# Patient Record
Sex: Female | Born: 1960 | Race: White | Hispanic: No | Marital: Married | Smoking: Former smoker
Health system: Southern US, Community
[De-identification: ages and names within clinical notes are randomized; demographics above are authoritative.]

## PROBLEM LIST (undated history)

## (undated) DIAGNOSIS — L309 Dermatitis, unspecified: Secondary | ICD-10-CM

## (undated) DIAGNOSIS — Z9109 Other allergy status, other than to drugs and biological substances: Secondary | ICD-10-CM

## (undated) DIAGNOSIS — E119 Type 2 diabetes mellitus without complications: Secondary | ICD-10-CM

## (undated) DIAGNOSIS — I10 Essential (primary) hypertension: Secondary | ICD-10-CM

## (undated) DIAGNOSIS — K219 Gastro-esophageal reflux disease without esophagitis: Secondary | ICD-10-CM

## (undated) DIAGNOSIS — M797 Fibromyalgia: Secondary | ICD-10-CM

## (undated) HISTORY — PX: CHOLECYSTECTOMY: SHX55

---

## 2013-08-22 ENCOUNTER — Encounter (HOSPITAL_COMMUNITY): Payer: Self-pay | Admitting: Emergency Medicine

## 2013-08-22 ENCOUNTER — Emergency Department (HOSPITAL_COMMUNITY)
Admission: EM | Admit: 2013-08-22 | Discharge: 2013-08-22 | Disposition: A | Discharge status: Home / Self Care | Payer: BC Managed Care – PPO | Attending: Emergency Medicine | Admitting: Emergency Medicine

## 2013-08-22 ENCOUNTER — Emergency Department (HOSPITAL_COMMUNITY): Payer: BC Managed Care – PPO

## 2013-08-22 DIAGNOSIS — K219 Gastro-esophageal reflux disease without esophagitis: Secondary | ICD-10-CM | POA: Insufficient documentation

## 2013-08-22 DIAGNOSIS — R42 Dizziness and giddiness: Secondary | ICD-10-CM | POA: Insufficient documentation

## 2013-08-22 DIAGNOSIS — Z791 Long term (current) use of non-steroidal anti-inflammatories (NSAID): Secondary | ICD-10-CM | POA: Insufficient documentation

## 2013-08-22 DIAGNOSIS — R079 Chest pain, unspecified: Secondary | ICD-10-CM

## 2013-08-22 DIAGNOSIS — M549 Dorsalgia, unspecified: Secondary | ICD-10-CM

## 2013-08-22 DIAGNOSIS — Z7982 Long term (current) use of aspirin: Secondary | ICD-10-CM | POA: Insufficient documentation

## 2013-08-22 DIAGNOSIS — R61 Generalized hyperhidrosis: Secondary | ICD-10-CM | POA: Insufficient documentation

## 2013-08-22 DIAGNOSIS — I10 Essential (primary) hypertension: Secondary | ICD-10-CM | POA: Insufficient documentation

## 2013-08-22 DIAGNOSIS — IMO0001 Reserved for inherently not codable concepts without codable children: Secondary | ICD-10-CM | POA: Insufficient documentation

## 2013-08-22 DIAGNOSIS — E119 Type 2 diabetes mellitus without complications: Secondary | ICD-10-CM | POA: Insufficient documentation

## 2013-08-22 DIAGNOSIS — Z79899 Other long term (current) drug therapy: Secondary | ICD-10-CM | POA: Insufficient documentation

## 2013-08-22 DIAGNOSIS — R748 Abnormal levels of other serum enzymes: Secondary | ICD-10-CM

## 2013-08-22 DIAGNOSIS — R1013 Epigastric pain: Secondary | ICD-10-CM

## 2013-08-22 DIAGNOSIS — IMO0002 Reserved for concepts with insufficient information to code with codable children: Secondary | ICD-10-CM | POA: Insufficient documentation

## 2013-08-22 HISTORY — DX: Gastro-esophageal reflux disease without esophagitis: K21.9

## 2013-08-22 HISTORY — DX: Type 2 diabetes mellitus without complications: E11.9

## 2013-08-22 HISTORY — DX: Essential (primary) hypertension: I10

## 2013-08-22 HISTORY — DX: Fibromyalgia: M79.7

## 2013-08-22 LAB — COMPREHENSIVE METABOLIC PANEL
ALT: 37 U/L — ABNORMAL HIGH (ref 0–35)
AST: 49 U/L — ABNORMAL HIGH (ref 0–37)
Albumin: 4.2 g/dL (ref 3.5–5.2)
Alkaline Phosphatase: 150 U/L — ABNORMAL HIGH (ref 39–117)
BUN: 21 mg/dL (ref 6–23)
CO2: 27 mEq/L (ref 19–32)
Calcium: 9.5 mg/dL (ref 8.4–10.5)
Chloride: 100 mEq/L (ref 96–112)
Creatinine, Ser: 0.85 mg/dL (ref 0.50–1.10)
GFR calc Af Amer: >90 mL/min (ref >90)
GFR calc non Af Amer: 78 mL/min — ABNORMAL LOW (ref >90)
Glucose, Bld: 105 mg/dL — ABNORMAL HIGH (ref 70–99)
Potassium: 3.7 mEq/L (ref 3.7–5.3)
Sodium: 141 mEq/L (ref 137–147)
Total Bilirubin: 0.9 mg/dL (ref 0.3–1.2)
Total Protein: 7.9 g/dL (ref 6.0–8.3)

## 2013-08-22 LAB — CBC
HCT: 43.7 % (ref 36.0–46.0)
Hemoglobin: 14.9 g/dL (ref 12.0–15.0)
MCH: 30.6 pg (ref 26.0–34.0)
MCHC: 34.1 g/dL (ref 30.0–36.0)
MCV: 89.7 fL (ref 78.0–100.0)
Platelets: 272 10*3/uL (ref 150–400)
RBC: 4.87 MIL/uL (ref 3.87–5.11)
RDW: 13.4 % (ref 11.5–15.5)
WBC: 8.2 10*3/uL (ref 4.0–10.5)

## 2013-08-22 LAB — I-STAT TROPONIN, ED: Troponin i, poc: 0 ng/mL (ref 0.00–0.08)

## 2013-08-22 LAB — LIPASE, BLOOD: Lipase: 35 U/L (ref 11–59)

## 2013-08-22 NOTE — ED Notes (Signed)
Called lab to inquire about pending lipase results, specimen already collected with others, will be processed at this time.

## 2013-08-22 NOTE — ED Notes (Signed)
Pt reports she had back pressure that then radiated into chest area around 9:15. Pressure has subsided. Got diaphoretic and felt faint. Had similar episode last week. Went away. Denies nausea and vomiting.

## 2013-08-22 NOTE — ED Provider Notes (Signed)
CSN: [id number]     Arrival date & time 08/22/13  4098 History   First MD Initiated Contact with Patient 08/22/13 1131     Chief Complaint  Patient presents with  . Chest Pain  . Back Pain     HPI  Amber Gates is a 53 y.o. female with a PMH of HTN, GERD, DM, and fibromyalgia who presents to the ED for evaluation of chest and back pain. History was provided by the patient. 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 Pain was constant for about 20 minutes and resolved spontaneously. Describes this as a squeezing sensation. She states she has had three more flare-ups of pain since this began however denies any pain currently. She describes this as a tightening pain. Nothing made the pain better/worse. Nothing provided for pain PTA. States this feels similar to before she had her gallbladder removed. Associated symptoms included diaphoresis and lightheadedness. No SOB, syncope, nausea, or vomiting. No cocaine use. FH heart disease in dad at age 74. No tobacco use. Patient otherwise has been well. No fevers, chills, change in appetite/activity, cough, diarrhea, dysuria, constipation, or headache.    Past Medical History  Diagnosis Date  . Hypertension   . GERD (gastroesophageal reflux disease)   . Diabetes mellitus without complication   . Fibromyalgia    No past surgical history on file. No family history on file. History  Substance Use Topics  . Smoking status: Not on file  . Smokeless tobacco: Not on file  . Alcohol Use: Not on file   OB History   Grav Para Term Preterm Abortions TAB SAB Ect Mult Living                 Review of Systems  Constitutional: Positive for diaphoresis (resolved). Negative for fever, chills, activity change, appetite change and fatigue.  Respiratory: Negative for cough, chest tightness, shortness of breath and  wheezing.   Cardiovascular: Positive for chest pain (resolved).  Gastrointestinal: Positive for abdominal pain (epigastric). Negative for nausea, vomiting, diarrhea and constipation.  Genitourinary: Negative for dysuria.  Musculoskeletal: Positive for back pain (resolved).  Skin: Negative for wound.  Neurological: Positive for light-headedness (resolved). Negative for dizziness, syncope, weakness and headaches.   Allergies  Statins  Home Medications   Prior to Admission medications   Medication Sig Start Date End Date Taking? Authorizing Provider  aspirin EC 81 MG tablet Take 81 mg by mouth daily.   Yes Historical Provider, MD  atenolol (TENORMIN) 50 MG tablet Take 50 mg by mouth daily.   Yes Historical Provider, MD  cetirizine (ZYRTEC) 10 MG tablet Take 10 mg by mouth daily.   Yes Historical Provider, MD  dicyclomine (BENTYL) 10 MG capsule Take 10 mg by mouth daily.   Yes Historical Provider, MD  DULoxetine (CYMBALTA) 20 MG capsule Take 20 mg by mouth daily.   Yes Historical Provider, MD  ferrous sulfate 325 (65 FE) MG tablet Take 325 mg by mouth every Monday, Wednesday, and Friday.   Yes Historical Provider, MD  fosinopril (MONOPRIL) 10 MG tablet Take 5 mg by mouth daily.   Yes Historical Provider, MD  gabapentin (NEURONTIN) 300 MG capsule Take 300-600 mg by mouth at bedtime. Take 600   Yes Historical Provider, MD  hydrochlorothiazide (HYDRODIURIL) 25 MG tablet Take 25 mg by mouth daily.   Yes Historical Provider,  MD  lansoprazole (PREVACID) 30 MG capsule Take 30 mg by mouth daily at 12 noon.   Yes Historical Provider, MD  montelukast (SINGULAIR) 10 MG tablet Take 10 mg by mouth at bedtime.   Yes Historical Provider, MD  Naproxen Sod-Diphenhydramine (ALEVE PM) 220-25 MG TABS Take 1 tablet by mouth at bedtime as needed (for sleep).   Yes Historical Provider, MD  triamcinolone cream (KENALOG) 0.1 % Apply 1 application topically 2 (two) times daily.   Yes Historical Provider, MD   BP  122/58  Temp(Src) 97.9 F (36.6 C) (Oral)  Resp 16  SpO2 98%  Filed Vitals:   08/22/13 1330 08/22/13 1400 08/22/13 1430 08/22/13 1500  BP: 123/71 110/59 111/80 114/69  Pulse: 56 59 64 58  Temp:      TempSrc:      Resp: 12 17 18 11   SpO2: 98% 98% 98% 99%    Physical Exam  Vitals reviewed. Constitutional: She is oriented to person, place, and time. She appears well-developed and well-nourished. No distress.  HENT:  Head: Normocephalic and atraumatic.  Right Ear: External ear normal.  Left Ear: External ear normal.  Mouth/Throat: Oropharynx is clear and moist.  Eyes: Conjunctivae are normal. Right eye exhibits no discharge. Left eye exhibits no discharge.  Neck: Normal range of motion. Neck supple.  Cardiovascular: Normal rate, regular rhythm and normal heart sounds.  Exam reveals no gallop and no friction rub.   No murmur heard. Pulmonary/Chest: Effort normal and breath sounds normal. No respiratory distress. She has no wheezes. She has no rales. She exhibits no tenderness.  Abdominal: Soft. Bowel sounds are normal. She exhibits no distension and no mass. There is tenderness. There is no rebound and no guarding.  Mild epigastric tenderness to palpation  Musculoskeletal: Normal range of motion. She exhibits no edema and no tenderness.  No LE edema or calf tenderness bilaterally. No tenderness to palpation to the thoracic or lumbar spinous processes throughout.  No tenderness to palpation to the paraspinal muscles throughout.    Neurological: She is alert and oriented to person, place, and time.  Skin: Skin is warm and dry. She is not diaphoretic.    ED Course  Procedures (including critical care time) Labs Review Labs Reviewed  COMPREHENSIVE METABOLIC PANEL - Abnormal; Notable for the following:    Glucose, Bld 105 (*)    AST 49 (*)    ALT 37 (*)    Alkaline Phosphatase 150 (*)    GFR calc non Af Amer 78 (*)    All other components within normal limits  CBC  I-STAT  TROPOININ, ED    Imaging Review Dg Chest 2 View  08/22/2013   CLINICAL DATA:  Hypertension and diabetes  EXAM: CHEST  2 VIEW  COMPARISON:  None.  FINDINGS: The heart size and mediastinal contours are within normal limits. Both lungs are clear. The visualized skeletal structures are unremarkable.  IMPRESSION: No active cardiopulmonary disease.   Electronically Signed   By: Signa Kellaylor  Stroud M.D.   On: 08/22/2013 10:32     EKG Interpretation   Date/Time:  Wednesday Aug 22 2013 09:54:24 EDT Ventricular Rate:  65 PR Interval:  120 QRS Duration: 92 QT Interval:  386 QTC Calculation: 401 R Axis:   68 Text Interpretation:  Normal sinus rhythm Normal ECG Confirmed by Juleen ChinaKOHUT   MD, STEPHEN ([id number]) on 08/22/2013 10:58:20 AM      Results for orders placed during the hospital encounter of 08/22/13  COMPREHENSIVE METABOLIC PANEL  Result Value Ref Range   Sodium 141  137 - 147 mEq/L   Potassium 3.7  3.7 - 5.3 mEq/L   Chloride 100  96 - 112 mEq/L   CO2 27  19 - 32 mEq/L   Glucose, Bld 105 (*) 70 - 99 mg/dL   BUN 21  6 - 23 mg/dL   Creatinine, Ser 2.130.85  0.50 - 1.10 mg/dL   Calcium 9.5  8.4 - 08.610.5 mg/dL   Total Protein 7.9  6.0 - 8.3 g/dL   Albumin 4.2  3.5 - 5.2 g/dL   AST 49 (*) 0 - 37 U/L   ALT 37 (*) 0 - 35 U/L   Alkaline Phosphatase 150 (*) 39 - 117 U/L   Total Bilirubin 0.9  0.3 - 1.2 mg/dL   GFR calc non Af Amer 78 (*) >90 mL/min   GFR calc Af Amer >90  >90 mL/min  CBC      Result Value Ref Range   WBC 8.2  4.0 - 10.5 K/uL   RBC 4.87  3.87 - 5.11 MIL/uL   Hemoglobin 14.9  12.0 - 15.0 g/dL   HCT 57.843.7  46.936.0 - 62.946.0 %   MCV 89.7  78.0 - 100.0 fL   MCH 30.6  26.0 - 34.0 pg   MCHC 34.1  30.0 - 36.0 g/dL   RDW 52.813.4  41.311.5 - 24.415.5 %   Platelets 272  150 - 400 K/uL  LIPASE, BLOOD      Result Value Ref Range   Lipase 35  11 - 59 U/L  I-STAT TROPOININ, ED      Result Value Ref Range   Troponin i, poc 0.00  0.00 - 0.08 ng/mL   Comment 3              MDM   Amber ParadiseLisa Campus is a 53  y.o. female with a PMH of HTN, GERD, DM, and fibromyalgia who presents to the ED for evaluation of chest and back pain. Etiology of pain unclear. This appears to be more epigastric abdominal pain vs what was initially described as chest pain. Labs revealed mildly elevated liver enzymes (AST 49 and ALT 37). Labs otherwise unremarkable. Abdominal exam benign. Chest x-ray negative for an acute cardiopulmonary process. Troponin negative. EKG negative for any acute ischemic changes. Patient asymptomatic throughout her ED visit. Vital signs stable. Will follow-up with her PCP this week. She may require stress testing as she has not had this in several years. Return precautions, discharge instructions, and follow-up was discussed with the patient before discharge.      Rechecks   2:00 PM = Patient asymptomatic.  3:30 PM = Patient continues to be asymptomatic. Dressed and ready for discharge.    Discharge Medication List as of 08/22/2013  3:46 PM      Final impressions: 1. Epigastric pain   2. Back pain   3. Chest pain   4. Elevated liver enzymes       Amber IronJessica Katlin Fannie Alomar PA-C   This patient was discussed with Dr. Wenda LowKohut          Eloise Mula K Kalaysia Demonbreun, PA-C 08/23/13 1120

## 2013-08-22 NOTE — Discharge Instructions (Signed)
- Return to the emergency department if you develop any changing/worsening condition, chest pain, difficulty breathing, fever, coughing up blood, repeated vomiting, feeling like you are doin or any other concerns (please read additional information regarding your condition below) - Follow-up with your doctor regarding your elevated liver enzymes     Chest Pain (Nonspecific) It is often hard to give a specific diagnosis for the cause of chest pain. There is always a chance that your pain could be related to something serious, such as a heart attack or a blood clot in the lungs. You need to follow up with your caregiver for further evaluation. CAUSES   Heartburn.  Pneumonia or bronchitis.  Anxiety or stress.  Inflammation around your heart (pericarditis) or lung (pleuritis or pleurisy).  A blood clot in the lung.  A collapsed lung (pneumothorax). It can develop suddenly on its own (spontaneous pneumothorax) or from injury (trauma) to the chest.  Shingles infection (herpes zoster virus). The chest wall is composed of bones, muscles, and cartilage. Any of these can be the source of the pain.  The bones can be bruised by injury.  The muscles or cartilage can be strained by coughing or overwork.  The cartilage can be affected by inflammation and become sore (costochondritis). DIAGNOSIS  Lab tests or other studies, such as X-rays, electrocardiography, stress testing, or cardiac imaging, may be needed to find the cause of your pain.  TREATMENT   Treatment depends on what may be causing your chest pain. Treatment may include:  Acid blockers for heartburn.  Anti-inflammatory medicine.  Pain medicine for inflammatory conditions.  Antibiotics if an infection is present.  You may be advised to change lifestyle habits. This includes stopping smoking and avoiding alcohol, caffeine, and chocolate.  You may be advised to keep your head raised (elevated) when sleeping. This reduces the  chance of acid going backward from your stomach into your esophagus.  Most of the time, nonspecific chest pain will improve within 2 to 3 days with rest and mild pain medicine. HOME CARE INSTRUCTIONS   If antibiotics were prescribed, take your antibiotics as directed. Finish them even if you start to feel better.  For the next few days, avoid physical activities that bring on chest pain. Continue physical activities as directed.  Do not smoke.  Avoid drinking alcohol.  Only take over-the-counter or prescription medicine for pain, discomfort, or fever as directed by your caregiver.  Follow your caregiver's suggestions for further testing if your chest pain does not go away.  Keep any follow-up appointments you made. If you do not go to an appointment, you could develop lasting (chronic) problems with pain. If there is any problem keeping an appointment, you must call to reschedule. SEEK MEDICAL CARE IF:   You think you are having problems from the medicine you are taking. Read your medicine instructions carefully.  Your chest pain does not go away, even after treatment.  You develop a rash with blisters on your chest. SEEK IMMEDIATE MEDICAL CARE IF:   You have increased chest pain or pain that spreads to your arm, neck, jaw, back, or abdomen.  You develop shortness of breath, an increasing cough, or you are coughing up blood.  You have severe back or abdominal pain, feel nauseous, or vomit.  You develop severe weakness, fainting, or chills.  You have a fever. THIS IS AN EMERGENCY. Do not wait to see if the pain will go away. Get medical help at once. Call your local emergency  services (911 in [place] Do not drive yourself to the hospital. MAKE SURE YOU:   Understand these instructions.  Will watch your condition.  Will get help right away if you are not doing well or get worse. Document Released: 01/06/2005 Document Revised: 06/21/2011 Document Reviewed:  11/02/2007 [facility] Patient Information 2014 [place], [place].   Abdominal Pain, Adult Many things can cause abdominal pain. Usually, abdominal pain is not caused by a disease and will improve without treatment. It can often be observed and treated at home. Your health care provider will do a physical exam and possibly order blood tests and X-rays to help determine the seriousness of your pain. However, in many cases, more time must pass before a clear cause of the pain can be found. Before that point, your health care provider may not know if you need more testing or further treatment. HOME CARE INSTRUCTIONS  Monitor your abdominal pain for any changes. The following actions may help to alleviate any discomfort you are experiencing:  Only take over-the-counter or prescription medicines as directed by your health care provider.  Do not take laxatives unless directed to do so by your health care provider.  Try a clear liquid diet (broth, tea, or water) as directed by your health care provider. Slowly move to a bland diet as tolerated. SEEK MEDICAL CARE IF:  You have unexplained abdominal pain.  You have abdominal pain associated with nausea or diarrhea.  You have pain when you urinate or have a bowel movement.  You experience abdominal pain that wakes you in the night.  You have abdominal pain that is worsened or improved by eating food.  You have abdominal pain that is worsened with eating fatty foods. SEEK IMMEDIATE MEDICAL CARE IF:   Your pain does not go away within 2 hours.  You have a fever.  You keep throwing up (vomiting).  Your pain is felt only in portions of the abdomen, such as the right side or the left lower portion of the abdomen.  You pass bloody or black tarry stools. MAKE SURE YOU:  Understand these instructions.   Will watch your condition.   Will get help right away if you are not doing well or get worse.  Document Released: 01/06/2005 Document  Revised: 01/17/2013 Document Reviewed: 12/06/2012 [facility] Patient Information 2014 [place], [place].  Back Pain, Adult Low back pain is very common. About 1 in 5 people have back pain.The cause of low back pain is rarely dangerous. The pain often gets better over time.About half of people with a sudden onset of back pain feel better in just 2 weeks. About 8 in 10 people feel better by 6 weeks.  CAUSES Some common causes of back pain include:  Strain of the muscles or ligaments supporting the spine.  Wear and tear (degeneration) of the spinal discs.  Arthritis.  Direct injury to the back. DIAGNOSIS Most of the time, the direct cause of low back pain is not known.However, back pain can be treated effectively even when the exact cause of the pain is unknown.Answering your caregiver's questions about your overall health and symptoms is one of the most accurate ways to make sure the cause of your pain is not dangerous. If your caregiver needs more information, he or she may order lab work or imaging tests (X-rays or MRIs).However, even if imaging tests show changes in your back, this usually does not require surgery. HOME CARE INSTRUCTIONS For many people, back pain returns.Since low back pain is rarely  dangerous, it is often a condition that people can learn to [facility]manageon their own.   Remain active. It is stressful on the back to sit or stand in one place. Do not sit, drive, or stand in one place for more than 30 minutes at a time. Take short walks on level surfaces as soon as pain allows.Try to increase the length of time you walk each day.  Do not stay in bed.Resting more than 1 or 2 days can delay your recovery.  Do not avoid exercise or work.Your body is made to move.It is not dangerous to be active, even though your back may hurt.Your back will likely heal faster if you return to being active before your pain is gone.  Pay attention to your body when you bend and lift. Many  people have less discomfortwhen lifting if they bend their knees, keep the load close to their bodies,and avoid twisting. Often, the most comfortable positions are those that put less stress on your recovering back.  Find a comfortable position to sleep. Use a firm mattress and lie on your side with your knees slightly bent. If you lie on your back, put a pillow under your knees.  Only take over-the-counter or prescription medicines as directed by your caregiver. Over-the-counter medicines to reduce pain and inflammation are often the most helpful.Your caregiver may prescribe muscle relaxant drugs.These medicines help dull your pain so you can more quickly return to your normal activities and healthy exercise.  Put ice on the injured area.  Put ice in a plastic bag.  Place a towel between your skin and the bag.  Leave the ice on for 15-20 minutes, 03-04 times a day for the first 2 to 3 days. After that, ice and heat may be alternated to reduce pain and spasms.  Ask your caregiver about trying back exercises and gentle massage. This may be of some benefit.  Avoid feeling anxious or stressed.Stress increases muscle tension and can worsen back pain.It is important to recognize when you are anxious or stressed and learn ways to manage it.Exercise is a great option. SEEK MEDICAL CARE IF:  You have pain that is not relieved with rest or medicine.  You have pain that does not improve in 1 week.  You have new symptoms.  You are generally not feeling well. SEEK IMMEDIATE MEDICAL CARE IF:   You have pain that radiates from your back into your legs.  You develop new bowel or bladder control problems.  You have unusual weakness or numbness in your arms or legs.  You develop nausea or vomiting.  You develop abdominal pain.  You feel faint. Document Released: 03/29/2005 Document Revised: 09/28/2011 Document Reviewed: 08/17/2010 [facility] Patient Information 2014 [place], [place].

## 2013-08-22 NOTE — ED Notes (Signed)
Patient transported to X-ray 

## 2013-08-27 NOTE — ED Provider Notes (Signed)
Medical screening examination/treatment/procedure(s) were conducted as a shared visit with non-physician practitioner(s) and myself.  I personally evaluated the patient during the encounter.   EKG Interpretation   Date/Time:  Wednesday Aug 22 2013 09:54:24 EDT Ventricular Rate:  65 PR Interval:  120 QRS Duration: 92 QT Interval:  386 QTC Calculation: 401 R Axis:   68 Text Interpretation:  Normal sinus rhythm Normal ECG Confirmed by Dwayna Kentner   MD, Lonza Shimabukuro ([id number]) on 08/22/2013 10:58:20 AM     52yF with cp/back pain. Doubt ACS. Doubt infectious, PE or dissection. ED w/u fairly unremarkable. Outpt FU to discuss stress testing.   Raeford RazorStephen Ivry Pigue, MD 08/27/13 [phone]

## 2015-12-08 IMAGING — CR DG CHEST 2V
2 series · 2 of 2 positions shown · non-contrast
Comparison: None.

CLINICAL DATA: Hypertension and diabetes

EXAM:
CHEST  2 VIEW

[w chest pa]
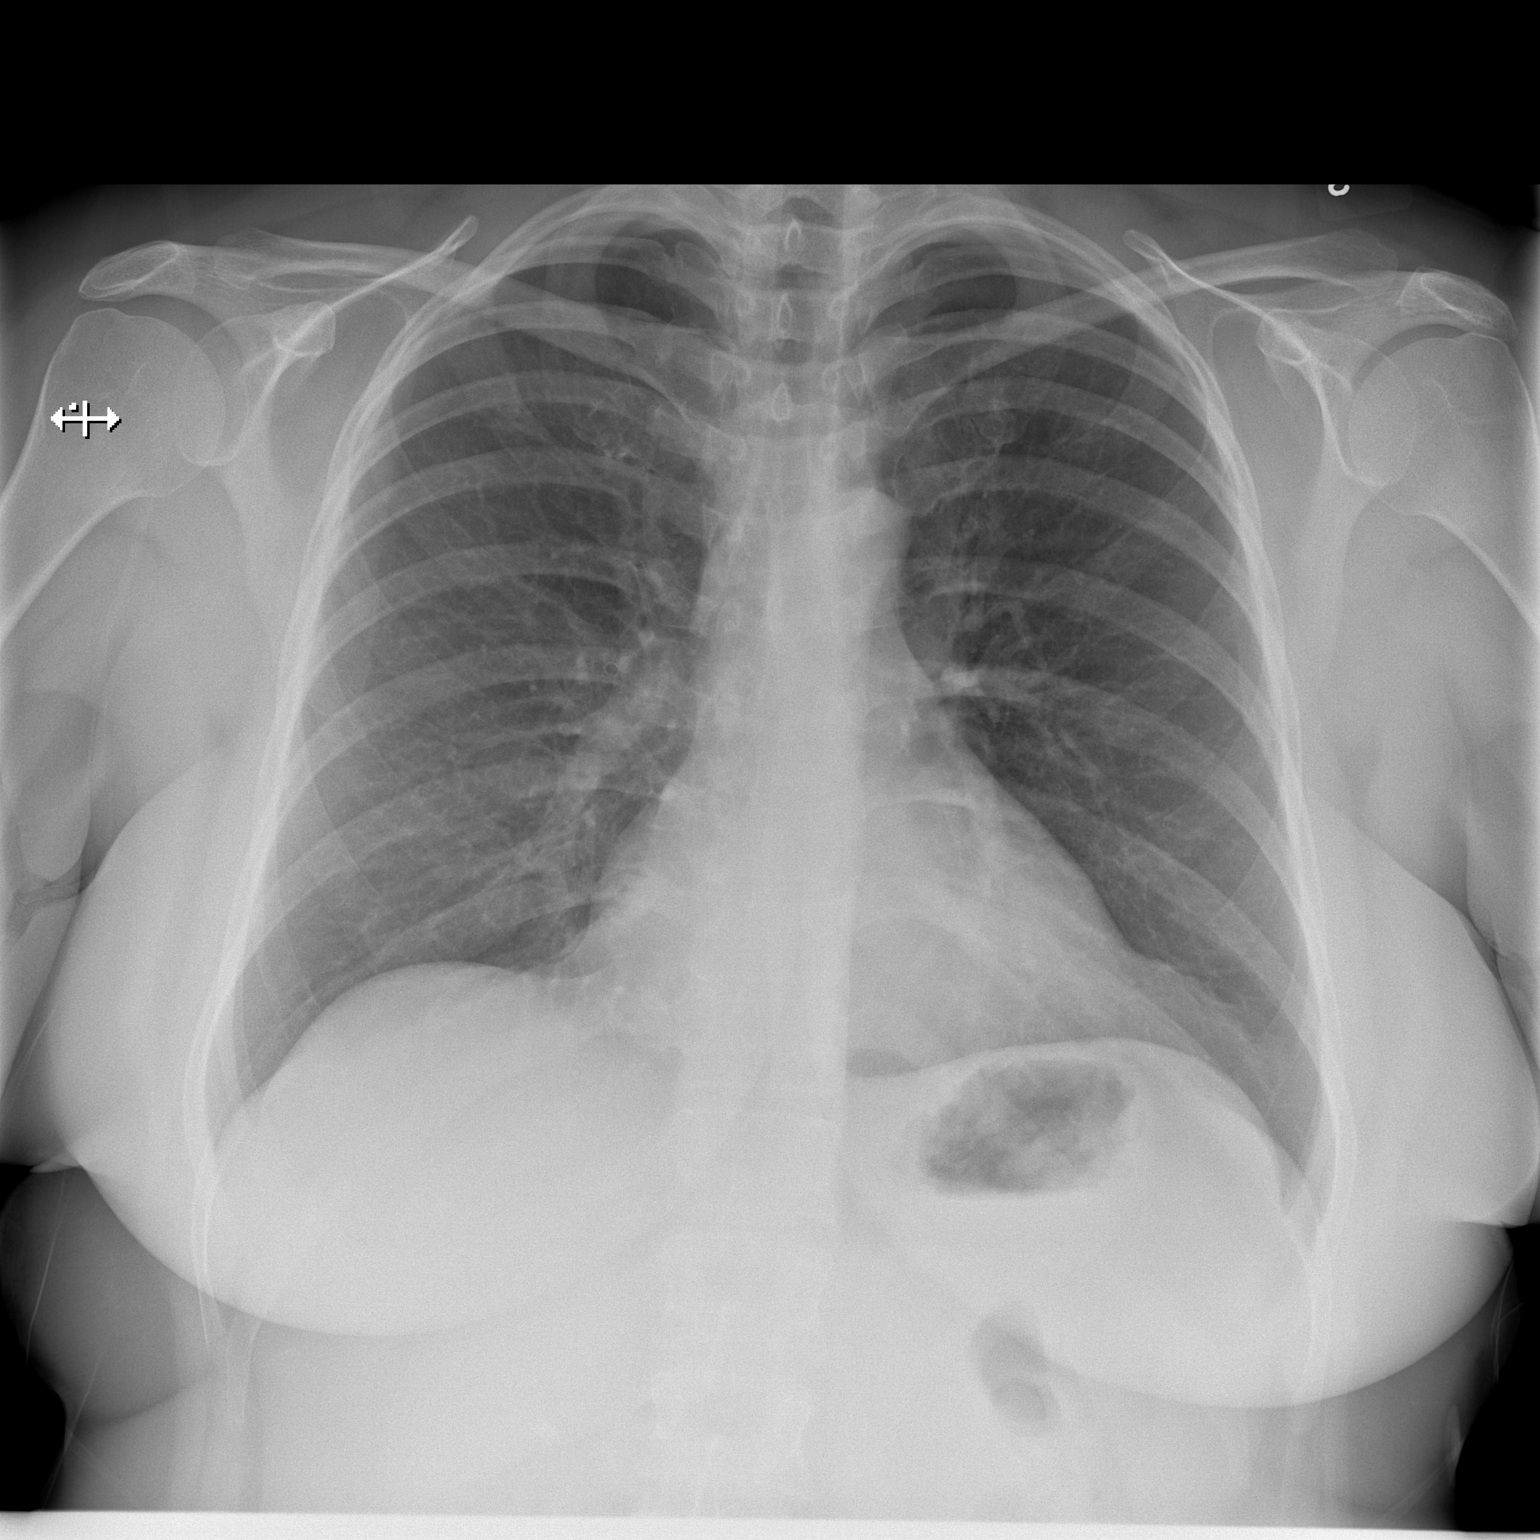

[w chest lat]
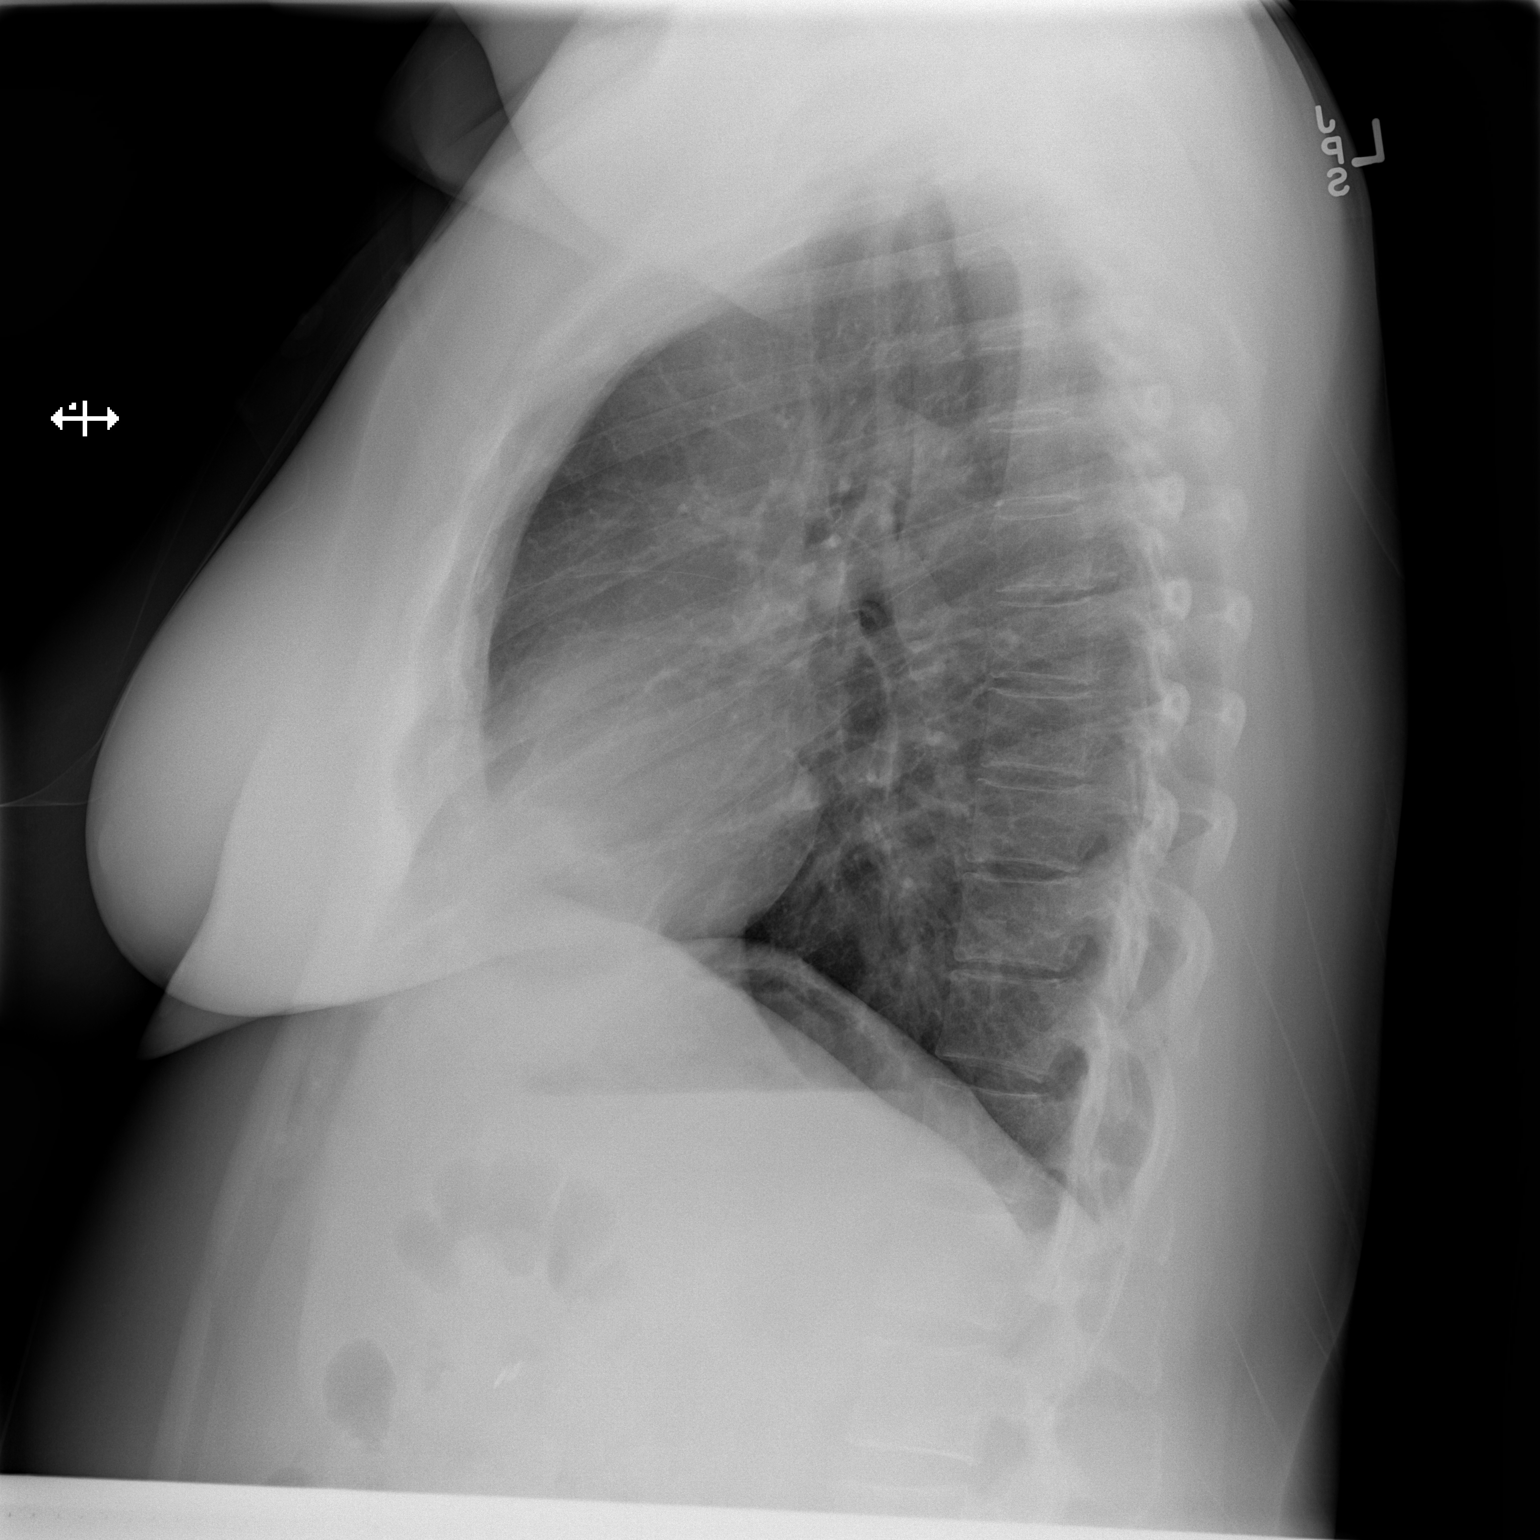

[2 of 2 positions shown; findings below may reference images not displayed]

FINDINGS: The heart size and mediastinal contours are within normal limits.
Both lungs are clear. The visualized skeletal structures are
unremarkable.
IMPRESSION: No active cardiopulmonary disease.

## 2019-01-25 ENCOUNTER — Other Ambulatory Visit: Payer: Self-pay

## 2019-01-25 ENCOUNTER — Emergency Department (INDEPENDENT_AMBULATORY_CARE_PROVIDER_SITE_OTHER)
Admission: EM | Admit: 2019-01-25 | Discharge: 2019-01-25 | Disposition: A | Discharge status: Home / Self Care | Payer: BC Managed Care – PPO | Source: Home / Self Care

## 2019-01-25 ENCOUNTER — Encounter: Payer: Self-pay | Admitting: *Deleted

## 2019-01-25 DIAGNOSIS — K429 Umbilical hernia without obstruction or gangrene: Secondary | ICD-10-CM | POA: Diagnosis not present

## 2019-01-25 NOTE — ED Triage Notes (Signed)
Pt c/o knot above her umbilical area x 2500 after Cholecystomy worse x 4-5 days.

## 2019-01-25 NOTE — ED Provider Notes (Addendum)
[facility]    CSN: [id number] Arrival date & time: 01/25/19  1811      History   Chief Complaint Chief Complaint  Patient presents with  . Abdominal Pain    HPI Amber Gates is a 58 y.o. female.   Is a 58 year old woman making her initial visit to [facility] urgent care.   Pt c/o knot above her umbilical area x 2012 after Cholecystomy worse x 4-5 days.        Past Medical History:  Diagnosis Date  . Diabetes mellitus without complication (HCC)   . Fibromyalgia   . GERD (gastroesophageal reflux disease)   . Hypertension     There are no active problems to display for this patient.   Past Surgical History:  Procedure Laterality Date  . CHOLECYSTECTOMY      OB History   No obstetric history on file.      Home Medications    Prior to Admission medications   Medication Sig Start Date End Date Taking? Authorizing Provider  aspirin EC 81 MG tablet Take 81 mg by mouth daily.   Yes [provider]  atenolol (TENORMIN) 50 MG tablet Take 50 mg by mouth daily.   Yes [provider]  cetirizine (ZYRTEC) 10 MG tablet Take 10 mg by mouth daily.   Yes [provider]  DULoxetine (CYMBALTA) 20 MG capsule Take 20 mg by mouth daily.   Yes [provider]  gabapentin (NEURONTIN) 300 MG capsule Take 300-600 mg by mouth at bedtime. Take 600   Yes [provider]  hydrochlorothiazide (HYDRODIURIL) 25 MG tablet Take 25 mg by mouth daily.   Yes [provider]  lansoprazole (PREVACID) 30 MG capsule Take 30 mg by mouth daily at 12 noon.   Yes [provider]  losartan (COZAAR) 25 MG tablet TAKE 1 TABLET(25 MG) BY MOUTH DAILY 11/11/16  Yes [provider]  montelukast (SINGULAIR) 10 MG tablet Take 10 mg by mouth at bedtime.   Yes [provider]  triamcinolone cream (KENALOG) 0.1 % Apply 1 application topically 2 (two) times daily.   Yes [provider]    Family History  Family History  Problem Relation Age of Onset  . Hypertension Mother   . Kidney disease Mother   . Heart disease Father   . Diabetes Father     Social History Social History   Tobacco Use  . Smoking status: Former Games developer  . Smokeless tobacco: Never Used  Substance Use Topics  . Alcohol use: Yes    Comment: occ   . Drug use: Never     Allergies   Diclofenac sodium, Fexofenadine, Lisinopril, Statins, Naltrexone-bupropion hcl er, and Orlistat   Review of Systems Review of Systems  Gastrointestinal: Positive for abdominal pain.  All other systems reviewed and are negative.    Physical Exam Triage Vital Signs ED Triage Vitals  Enc Vitals Group     BP 01/25/19 1828 (!) 151/81     Pulse Rate 01/25/19 1828 68     Resp 01/25/19 1828 18     Temp 01/25/19 1828 98.5 F (36.9 C)     Temp Source 01/25/19 1828 Oral     SpO2 01/25/19 1828 96 %     Weight 01/25/19 1829 272 lb (123.4 kg)     Height 01/25/19 1829 5\' 6"  (1.676 m)     Head Circumference --      Peak Flow --      Pain Score 01/25/19  1828 1     Pain Loc --      Pain Edu? --      Excl. in [place]? --    No data found.  Updated Vital Signs BP (!) 151/81 (BP Location: Right Arm)   Pulse 68   Temp 98.5 F (36.9 C) (Oral)   Resp 18   Ht 5\' 6"  (1.676 m)   Wt 123.4 kg   SpO2 96%   BMI 43.90 kg/m    Physical Exam Vitals signs and nursing note reviewed.  Constitutional:      Appearance: She is well-developed. She is obese.  HENT:     Head: Normocephalic and atraumatic.     Mouth/Throat:     Mouth: Mucous membranes are moist.  Pulmonary:     Effort: Pulmonary effort is normal.  Abdominal:     General: Bowel sounds are normal.     Palpations: Abdomen is soft.     Tenderness: There is abdominal tenderness in the periumbilical area.     Hernia: A hernia is present. Hernia is present in the umbilical area.  Skin:    General: Skin is warm.  Neurological:     General: No focal deficit present.     Mental  Status: She is alert.  Psychiatric:        Mood and Affect: Mood normal.      UC Treatments / Results  Labs (all labs ordered are listed, but only abnormal results are displayed) Labs Reviewed - No data to display  EKG   Radiology No results found.  Procedures Procedures (including critical care time)  Medications Ordered in UC Medications - No data to display  Initial Impression / Assessment and Plan / UC Course  I have reviewed the triage vital signs and the nursing notes.  Pertinent labs & imaging results that were available during my care of the patient were reviewed by me and considered in my medical decision making (see chart for details).    Final Clinical Impressions(s) / UC Diagnoses   Final diagnoses:  Umbilical hernia without obstruction and without gangrene   Discharge Instructions   None    ED Prescriptions    None     I have reviewed the PDMP during this encounter.   Robyn Haber, MD 01/25/19 1851    Robyn Haber, MD 01/25/19 [phone]

## 2019-11-10 ENCOUNTER — Other Ambulatory Visit: Payer: Self-pay

## 2019-11-10 ENCOUNTER — Emergency Department
Admission: EM | Admit: 2019-11-10 | Discharge: 2019-11-10 | Disposition: A | Discharge status: Home / Self Care | Payer: BC Managed Care – PPO | Source: Home / Self Care

## 2019-11-10 ENCOUNTER — Encounter: Payer: Self-pay | Admitting: Emergency Medicine

## 2019-11-10 DIAGNOSIS — B372 Candidiasis of skin and nail: Secondary | ICD-10-CM

## 2019-11-10 DIAGNOSIS — N61 Mastitis without abscess: Secondary | ICD-10-CM

## 2019-11-10 HISTORY — DX: Dermatitis, unspecified: L30.9

## 2019-11-10 HISTORY — DX: Other allergy status, other than to drugs and biological substances: Z91.09

## 2019-11-10 MED ORDER — SULFAMETHOXAZOLE-TRIMETHOPRIM 800-160 MG PO TABS: 1.0000 | ORAL_TABLET | Freq: Two times a day (BID) | ORAL | 0 refills | Status: AC

## 2019-11-10 MED ORDER — PROBIOTIC 1-250 BILLION-MG PO CAPS: 1.0000 | ORAL_CAPSULE | Freq: Every day | ORAL | 0 refills | Status: AC

## 2019-11-10 MED ORDER — CLOTRIMAZOLE 1 % EX CREA: TOPICAL_CREAM | CUTANEOUS | 0 refills | Status: AC

## 2019-11-10 NOTE — ED Provider Notes (Signed)
[name]    CSN: [id number] Arrival date & time: 11/10/19  2878      History   Chief Complaint Chief Complaint  Patient presents with  . Rash  . Breast Problem    HPI Amber Gates is a 59 y.o. female.   HPI Amber Gates is a 58 y.o. female presenting to UC with c/o gradually worsening redness and pain on the sternal underside of her Left breast for about 4-5 days.  She was treated with prednisone about 2 weeks ago for a generalized rash. This area of redness is much different than the initial rash she was treated for.  Denies fever, chills, n/v/d.   Pt also c/o a mildly itching burning red rash under both breast c/w prior episodes of yeast infections.   Past Medical History:  Diagnosis Date  . Diabetes mellitus without complication (HCC)   . Eczema   . Environmental allergies   . Fibromyalgia   . GERD (gastroesophageal reflux disease)   . Hypertension     There are no problems to display for this patient.   Past Surgical History:  Procedure Laterality Date  . CHOLECYSTECTOMY      OB History   No obstetric history on file.      Home Medications    Prior to Admission medications   Medication Sig Start Date End Date Taking? Authorizing Provider  aspirin EC 81 MG tablet Take 81 mg by mouth daily.    [provider]  atenolol (TENORMIN) 50 MG tablet Take 50 mg by mouth daily.    [provider]  Bacillus Coagulans-Inulin (PROBIOTIC) 1-250 BILLION-MG CAPS Take 1 capsule by mouth daily. 11/10/19   Lurene Shadow, PA-C  cetirizine (ZYRTEC) 10 MG tablet Take 10 mg by mouth daily.    [provider]  clotrimazole (LOTRIMIN) 1 % cream Apply to affected area 2 times daily 11/10/19   Lurene Shadow, PA-C  DULoxetine (CYMBALTA) 20 MG capsule Take 30 mg by mouth daily.     [provider]  gabapentin (NEURONTIN) 300 MG capsule Take 300-600 mg by mouth at bedtime. Take 600    [provider]  hydrochlorothiazide  (HYDRODIURIL) 25 MG tablet Take 25 mg by mouth daily.    [provider]  lansoprazole (PREVACID) 30 MG capsule Take 30 mg by mouth daily at 12 noon.    [provider]  losartan (COZAAR) 25 MG tablet TAKE 1 TABLET(25 MG) BY MOUTH DAILY 11/11/16   [provider]  montelukast (SINGULAIR) 10 MG tablet Take 10 mg by mouth at bedtime.    [provider]  sulfamethoxazole-trimethoprim (BACTRIM DS) 800-160 MG tablet Take 1 tablet by mouth 2 (two) times daily for 7 days. 11/10/19 11/17/19  Lurene Shadow, PA-C  triamcinolone cream (KENALOG) 0.1 % Apply 1 application topically 2 (two) times daily.    [provider]    Family History Family History  Problem Relation Age of Onset  . Hypertension Mother   . Kidney disease Mother   . Heart disease Father   . Diabetes Father     Social History Social History   Tobacco Use  . Smoking status: Former Games developer  . Smokeless tobacco: Never Used  Vaping Use  . Vaping Use: Never used  Substance Use Topics  . Alcohol use: Yes    Comment: occ   . Drug use: Never     Allergies   Diclofenac sodium, Fexofenadine, Lisinopril, Statins, and Orlistat   Review  of Systems Review of Systems  Constitutional: Negative for chills and fever.  Cardiovascular: Positive for chest pain ( left breast pain).  Skin: Positive for color change. Negative for wound.     Physical Exam Triage Vital Signs ED Triage Vitals  Enc Vitals Group     BP 11/10/19 0946 (!) 134/84     Pulse Rate 11/10/19 0946 79     Resp 11/10/19 0946 16     Temp 11/10/19 0946 98.6 F (37 C)     Temp Source 11/10/19 0946 Oral     SpO2 11/10/19 0946 97 %     Weight 11/10/19 0947 (!) 257 lb (116.6 kg)     Height 11/10/19 0947 5\' 6"  (1.676 m)     Head Circumference --      Peak Flow --      Pain Score 11/10/19 0946 2     Pain Loc --      Pain Edu? --      Excl. in [place]? --    No data found.  Updated Vital Signs BP (!) 134/84 (BP Location:  Right Arm)   Pulse 79   Temp 98.6 F (37 C) (Oral)   Resp 16   Ht 5\' 6"  (1.676 m)   Wt (!) 257 lb (116.6 kg)   SpO2 97%   BMI 41.48 kg/m   Visual Acuity Right Eye Distance:   Left Eye Distance:   Bilateral Distance:    Right Eye Near:   Left Eye Near:    Bilateral Near:     Physical Exam Vitals and nursing note reviewed.  Constitutional:      Appearance: Normal appearance. She is well-developed.  HENT:     Head: Normocephalic and atraumatic.  Cardiovascular:     Rate and Rhythm: Normal rate and regular rhythm.  Pulmonary:     Effort: Pulmonary effort is normal.     Breath sounds: Normal breath sounds.  Chest:    Musculoskeletal:        General: Normal range of motion.     Cervical back: Normal range of motion.  Skin:    General: Skin is warm and dry.     Findings: Rash present.  Neurological:     Mental Status: She is alert and oriented to person, place, and time.  Psychiatric:        Behavior: Behavior normal.      UC Treatments / Results  Labs (all labs ordered are listed, but only abnormal results are displayed) Labs Reviewed - No data to display  EKG   Radiology No results found.  Procedures Procedures (including critical care time)  Medications Ordered in UC Medications - No data to display  Initial Impression / Assessment and Plan / UC Course  I have reviewed the triage vital signs and the nursing notes.  Pertinent labs & imaging results that were available during my care of the patient were reviewed by me and considered in my medical decision making (see chart for details).     Hx and exam c/w cellulitis of Left breast and candidal intertrigo Will tx with bactrim and lotrimin cream Pt requested prescription for probiotics because antibiotics cause her GI upset.  Encouraged f/u within 3-4 days, sooner if worsening AVS provided  Final Clinical Impressions(s) / UC Diagnoses   Final diagnoses:  Cellulitis of left breast  Candidal  intertrigo     Discharge Instructions      Take the medication as prescribed. You may use a warm  damp washcloth 2-3 times daily for 10-15 minutes at a time over the area of pain.  Follow up in 3-4 days if symptoms not improving.     ED Prescriptions    Medication Sig Dispense Auth. Provider   sulfamethoxazole-trimethoprim (BACTRIM DS) 800-160 MG tablet Take 1 tablet by mouth 2 (two) times daily for 7 days. 14 tablet Doroteo Glassman, Taygan Connell O, PA-C   clotrimazole (LOTRIMIN) 1 % cream Apply to affected area 2 times daily 15 g Kytzia Gienger O, PA-C   Bacillus Coagulans-Inulin (PROBIOTIC) 1-250 BILLION-MG CAPS Take 1 capsule by mouth daily. 10 capsule Lurene Shadow, PA-C     PDMP not reviewed this encounter.   Lurene Shadow, [place] 11/12/19 [phone]

## 2019-11-10 NOTE — ED Triage Notes (Signed)
Patient here for rash on left breast; was treated 2 weeks ago for generalized rash with prednisone rx that was completed 4-5 days ago. Has had covid vaccination.

## 2019-11-10 NOTE — Discharge Instructions (Signed)
  Take the medication as prescribed. You may use a warm damp washcloth 2-3 times daily for 10-15 minutes at a time over the area of pain.  Follow up in 3-4 days if symptoms not improving.

## 2020-03-18 ENCOUNTER — Emergency Department (INDEPENDENT_AMBULATORY_CARE_PROVIDER_SITE_OTHER)
Admission: EM | Admit: 2020-03-18 | Discharge: 2020-03-18 | Disposition: A | Discharge status: Home / Self Care | Payer: BC Managed Care – PPO | Source: Home / Self Care

## 2020-03-18 ENCOUNTER — Other Ambulatory Visit: Payer: Self-pay

## 2020-03-18 DIAGNOSIS — L720 Epidermal cyst: Secondary | ICD-10-CM

## 2020-03-18 MED ORDER — SULFAMETHOXAZOLE-TRIMETHOPRIM 800-160 MG PO TABS: 1.0000 | ORAL_TABLET | Freq: Two times a day (BID) | ORAL | 0 refills | Status: AC

## 2020-03-18 NOTE — ED Provider Notes (Signed)
[name]    CSN: [id number] Arrival date & time: 03/18/20  9323      History   Chief Complaint Chief Complaint  Patient presents with  . Cyst    HPI Amber Gates is a 59 y.o. female.   HPI   Patient is here for cyst on her right breast.  It is painless.  She is worried that she may have an infection.  She had a similar infection a couple months back that required incision and drainage.  She just had a mammogram in November.  Her mammogram was negative.  She has never had an abnormal mammogram or any significant breast problem.  Past Medical History:  Diagnosis Date  . Eczema   . Environmental allergies   . Fibromyalgia   . GERD (gastroesophageal reflux disease)   . Hypertension     There are no problems to display for this patient.   Past Surgical History:  Procedure Laterality Date  . CHOLECYSTECTOMY      OB History   No obstetric history on file.      Home Medications    Prior to Admission medications   Medication Sig Start Date End Date Taking? Authorizing Provider  aspirin EC 81 MG tablet Take 81 mg by mouth daily.    [provider]  atenolol (TENORMIN) 50 MG tablet Take 50 mg by mouth daily.    [provider]  Bacillus Coagulans-Inulin (PROBIOTIC) 1-250 BILLION-MG CAPS Take 1 capsule by mouth daily. 11/10/19   Lurene Shadow, PA-C  cetirizine (ZYRTEC) 10 MG tablet Take 10 mg by mouth daily.    [provider]  clotrimazole (LOTRIMIN) 1 % cream Apply to affected area 2 times daily 11/10/19   Lurene Shadow, PA-C  DULoxetine (CYMBALTA) 20 MG capsule Take 30 mg by mouth daily.     [provider]  DULoxetine (CYMBALTA) 20 MG capsule Take by mouth.    [provider]  gabapentin (NEURONTIN) 300 MG capsule Take 300-600 mg by mouth at bedtime. Take 600    [provider]  hydrochlorothiazide (HYDRODIURIL) 25 MG tablet Take 25 mg by mouth daily.    [provider]  lansoprazole  (PREVACID) 30 MG capsule Take 30 mg by mouth daily at 12 noon.    [provider]  losartan (COZAAR) 25 MG tablet TAKE 1 TABLET(25 MG) BY MOUTH DAILY 11/11/16   [provider]  montelukast (SINGULAIR) 10 MG tablet Take 10 mg by mouth at bedtime.    [provider]  sulfamethoxazole-trimethoprim (BACTRIM DS) 800-160 MG tablet Take 1 tablet by mouth 2 (two) times daily for 7 days. 03/18/20 03/25/20  Eustace Moore, MD  triamcinolone cream (KENALOG) 0.1 % Apply 1 application topically 2 (two) times daily.    [provider]    Family History Family History  Problem Relation Age of Onset  . Hypertension Mother   . Kidney disease Mother   . Heart disease Father   . Diabetes Father     Social History Social History   Tobacco Use  . Smoking status: Former Games developer  . Smokeless tobacco: Never Used  Vaping Use  . Vaping Use: Never used  Substance Use Topics  . Alcohol use: Yes    Comment: occ   . Drug use: Never     Allergies   Diclofenac sodium, Fexofenadine, Lisinopril, Statins, and Orlistat   Review of Systems Review of Systems   Physical Exam Triage Vital Signs ED Triage Vitals  Enc Vitals Group     BP 03/18/20 0836 118/81     Pulse Rate 03/18/20 0836 63     Resp --      Temp 03/18/20 0836 97.7 F (36.5 C)     Temp Source 03/18/20 0836 Oral     SpO2 03/18/20 0836 95 %     Weight 03/18/20 0832 265 lb (120.2 kg)     Height 03/18/20 0832 5\' 6"  (1.676 m)     Head Circumference --      Peak Flow --      Pain Score 03/18/20 0832 3     Pain Loc --      Pain Edu? --      Excl. in [place]? --    No data found.  Updated Vital Signs BP 118/81 (BP Location: Right Arm)   Pulse 63   Temp 97.7 F (36.5 C) (Oral)   Ht 5\' 6"  (1.676 m)   Wt 120.2 kg   SpO2 95%   BMI 42.77 kg/m       Physical Exam Constitutional:      General: She is not in acute distress.    Appearance: She is well-developed.  HENT:     Head: Normocephalic and  atraumatic.  Eyes:     Conjunctiva/sclera: Conjunctivae normal.     Pupils: Pupils are equal, round, and reactive to light.  Cardiovascular:     Rate and Rhythm: Normal rate.  Pulmonary:     Effort: Pulmonary effort is normal. No respiratory distress.  Chest:    Abdominal:     General: There is no distension.     Palpations: Abdomen is soft.  Musculoskeletal:        General: Normal range of motion.     Cervical back: Normal range of motion.  Skin:    General: Skin is warm and dry.  Neurological:     Mental Status: She is alert.  Psychiatric:        Behavior: Behavior normal.      UC Treatments / Results  Labs (all labs ordered are listed, but only abnormal results are displayed) Labs Reviewed - No data to display  EKG   Radiology No results found.  Procedures Procedures (including critical care time)  Medications Ordered in UC Medications - No data to display  Initial Impression / Assessment and Plan / UC Course  I have reviewed the triage vital signs and the nursing notes.  Pertinent labs & imaging results that were available during my care of the patient were reviewed by me and considered in my medical decision making (see chart for details).     Discussed with the patient that I feel this is a benign cyst.  My recommendation to her is to go to the breast center for an ultrasound and aspiration.  Patient does not wish to do this, she is currently private pay.  She states the last time she had 1 of these it did well with antibiotics.  We do reach a compromise that she will take antibiotics for 7 days and use warm compresses for 20 minutes every couple of hours.  If her cyst resolves, She may wait to her next mammogram.  If persistent does not resolve, she needs to call back for appointment at the breast center Final Clinical Impressions(s) / UC Diagnoses   Final diagnoses:  Inclusion cyst of right breast     Discharge Instructions     Take the antibiotic  2 x a day  Use the warm compresses for 20 min every couple of hours Call if this fails to improve to schedule ultrasound of breast   ED Prescriptions    Medication Sig Dispense Auth. Provider   sulfamethoxazole-trimethoprim (BACTRIM DS) 800-160 MG tablet Take 1 tablet by mouth 2 (two) times daily for 7 days. 14 tablet Eustace Moore, MD     PDMP not reviewed this encounter.   Eustace Moore, MD 03/18/20 [phone]

## 2020-03-18 NOTE — ED Triage Notes (Signed)
Pt presents with R breast cyst. Pt had pmh of cellulitis ib L breast 2 months ago. mammogram in November with all good results. Pt has tried warm compresses on new r breast cyst with no improvement.

## 2020-03-18 NOTE — Discharge Instructions (Addendum)
Take the antibiotic 2 x a day Use the warm compresses for 20 min every couple of hours Call if this fails to improve to schedule ultrasound of breast
